# Patient Record
Sex: Male | Born: 1985 | Race: White | Hispanic: No | Marital: Single | Smoking: Current every day smoker
Health system: Southern US, Community
[De-identification: ages and names within clinical notes are randomized; demographics above are authoritative.]

## PROBLEM LIST (undated history)

## (undated) DIAGNOSIS — Q6689 Other  specified congenital deformities of feet: Secondary | ICD-10-CM

---

## 2012-11-08 ENCOUNTER — Emergency Department: Payer: Self-pay | Admitting: Emergency Medicine

## 2013-12-18 ENCOUNTER — Emergency Department: Payer: Self-pay | Admitting: Emergency Medicine

## 2014-04-30 ENCOUNTER — Emergency Department: Payer: Self-pay | Admitting: Emergency Medicine

## 2014-05-14 ENCOUNTER — Emergency Department: Payer: Self-pay | Admitting: Emergency Medicine

## 2014-07-11 ENCOUNTER — Emergency Department: Payer: Self-pay | Admitting: Emergency Medicine

## 2014-07-11 LAB — URINALYSIS, COMPLETE
BACTERIA: NONE SEEN
BLOOD: NEGATIVE
Bilirubin,UR: NEGATIVE
GLUCOSE, UR: NEGATIVE mg/dL (ref 0–75)
Ketone: NEGATIVE
Leukocyte Esterase: NEGATIVE
NITRITE: NEGATIVE
PROTEIN: NEGATIVE
Ph: 6 (ref 4.5–8.0)
RBC,UR: NONE SEEN /HPF (ref 0–5)
SQUAMOUS EPITHELIAL: NONE SEEN
Specific Gravity: 1.014 (ref 1.003–1.030)
WBC UR: NONE SEEN /HPF (ref 0–5)

## 2014-07-14 ENCOUNTER — Emergency Department: Payer: Self-pay | Admitting: Emergency Medicine

## 2014-07-14 LAB — CBC
HCT: 47.1 % (ref 40.0–52.0)
HGB: 15.9 g/dL (ref 13.0–18.0)
MCH: 33 pg (ref 26.0–34.0)
MCHC: 33.8 g/dL (ref 32.0–36.0)
MCV: 97 fL (ref 80–100)
Platelet: 227 10*3/uL (ref 150–440)
RBC: 4.83 10*6/uL (ref 4.40–5.90)
RDW: 12.8 % (ref 11.5–14.5)
WBC: 9.6 10*3/uL (ref 3.8–10.6)

## 2014-07-14 LAB — COMPREHENSIVE METABOLIC PANEL
ALBUMIN: 4 g/dL (ref 3.4–5.0)
ALK PHOS: 81 U/L
AST: 30 U/L (ref 15–37)
Anion Gap: 7 (ref 7–16)
BILIRUBIN TOTAL: 0.2 mg/dL (ref 0.2–1.0)
BUN: 9 mg/dL (ref 7–18)
CALCIUM: 8.6 mg/dL (ref 8.5–10.1)
CHLORIDE: 110 mmol/L — AB (ref 98–107)
CO2: 25 mmol/L (ref 21–32)
Creatinine: 0.86 mg/dL (ref 0.60–1.30)
EGFR (African American): >60
EGFR (Non-African Amer.): >60
GLUCOSE: 101 mg/dL — AB (ref 65–99)
Osmolality: 282 (ref 275–301)
Potassium: 3.6 mmol/L (ref 3.5–5.1)
SGPT (ALT): 50 U/L (ref 12–78)
Sodium: 142 mmol/L (ref 136–145)
Total Protein: 8.1 g/dL (ref 6.4–8.2)

## 2014-07-14 LAB — ETHANOL
ETHANOL %: 0.184 % — AB (ref 0.000–0.080)
ETHANOL LVL: 184 mg/dL

## 2014-07-14 LAB — SALICYLATE LEVEL: Salicylates, Serum: 4.7 mg/dL — ABNORMAL HIGH

## 2014-07-14 LAB — ACETAMINOPHEN LEVEL: Acetaminophen: <2 ug/mL

## 2014-07-31 ENCOUNTER — Emergency Department: Payer: Self-pay | Admitting: Emergency Medicine

## 2014-07-31 LAB — URINALYSIS, COMPLETE
BILIRUBIN, UR: NEGATIVE
Bacteria: NONE SEEN
Blood: NEGATIVE
Glucose,UR: NEGATIVE mg/dL (ref 0–75)
KETONE: NEGATIVE
Leukocyte Esterase: NEGATIVE
Nitrite: NEGATIVE
Ph: 6 (ref 4.5–8.0)
Protein: NEGATIVE
RBC, UR: NONE SEEN /HPF (ref 0–5)
Specific Gravity: 1.005 (ref 1.003–1.030)
Squamous Epithelial: NONE SEEN

## 2014-08-08 ENCOUNTER — Emergency Department: Payer: Self-pay | Admitting: Emergency Medicine

## 2014-08-08 LAB — SYNOVIAL CELL COUNT + DIFF, W/ CRYSTALS
Basophil: 0 %
Crystals, Joint Fluid: NONE SEEN
EOS PCT: 0 %
Lymphocytes: 2 %
Neutrophils: 97 %
Nucleated Cell Count: 15697 /mm3
Other Cells BF: 0 %
Other Mononuclear Cells: 1 %

## 2014-08-08 LAB — GRAM STAIN

## 2014-08-31 ENCOUNTER — Emergency Department: Payer: Self-pay | Admitting: Emergency Medicine
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
## 2015-05-31 ENCOUNTER — Emergency Department: Payer: Medicaid Other

## 2015-05-31 ENCOUNTER — Encounter: Payer: Self-pay | Admitting: Emergency Medicine

## 2015-05-31 ENCOUNTER — Emergency Department
Admission: EM | Admit: 2015-05-31 | Discharge: 2015-06-01 | Disposition: A | Discharge status: Home / Self Care | Payer: Medicaid Other | Attending: Emergency Medicine | Admitting: Emergency Medicine

## 2015-05-31 DIAGNOSIS — Y9289 Other specified places as the place of occurrence of the external cause: Secondary | ICD-10-CM | POA: Diagnosis not present

## 2015-05-31 DIAGNOSIS — M21549 Acquired clubfoot, unspecified foot: Secondary | ICD-10-CM | POA: Insufficient documentation

## 2015-05-31 DIAGNOSIS — Y998 Other external cause status: Secondary | ICD-10-CM | POA: Insufficient documentation

## 2015-05-31 DIAGNOSIS — Z880 Allergy status to penicillin: Secondary | ICD-10-CM | POA: Diagnosis not present

## 2015-05-31 DIAGNOSIS — W010XXA Fall on same level from slipping, tripping and stumbling without subsequent striking against object, initial encounter: Secondary | ICD-10-CM | POA: Diagnosis not present

## 2015-05-31 DIAGNOSIS — M79671 Pain in right foot: Secondary | ICD-10-CM

## 2015-05-31 DIAGNOSIS — S99921A Unspecified injury of right foot, initial encounter: Secondary | ICD-10-CM | POA: Diagnosis not present

## 2015-05-31 DIAGNOSIS — Z720 Tobacco use: Secondary | ICD-10-CM | POA: Diagnosis not present

## 2015-05-31 DIAGNOSIS — Y9389 Activity, other specified: Secondary | ICD-10-CM | POA: Diagnosis not present

## 2015-05-31 HISTORY — DX: Other specified congenital deformities of feet: Q66.89

## 2015-05-31 MED ORDER — HYDROCODONE-ACETAMINOPHEN 5-325 MG PO TABS
ORAL_TABLET | ORAL | Status: AC
  Filled 2015-05-31: qty 1

## 2015-05-31 MED ORDER — IBUPROFEN 800 MG PO TABS: 800.0000 mg | ORAL_TABLET | Freq: Once | ORAL | Status: DC

## 2015-05-31 MED ORDER — HYDROCODONE-ACETAMINOPHEN 5-325 MG PO TABS: 1.0000 | ORAL_TABLET | ORAL | Status: AC | PRN

## 2015-05-31 MED ORDER — HYDROCODONE-ACETAMINOPHEN 5-325 MG PO TABS
1.0000 | ORAL_TABLET | Freq: Once | ORAL | Status: DC
Start: 2015-05-31 — End: 2015-06-01

## 2015-05-31 MED ORDER — HYDROCODONE-ACETAMINOPHEN 5-325 MG PO TABS
2.0000 | ORAL_TABLET | Freq: Once | ORAL | Status: DC
Start: 2015-05-31 — End: 2015-05-31

## 2015-05-31 MED ORDER — IBUPROFEN 800 MG PO TABS: 800.0000 mg | ORAL_TABLET | Freq: Three times a day (TID) | ORAL | Status: AC | PRN

## 2015-05-31 MED ORDER — IBUPROFEN 800 MG PO TABS
ORAL_TABLET | ORAL | Status: DC
Start: 2015-05-31 — End: 2015-05-31
  Filled 2015-05-31: qty 1

## 2015-05-31 NOTE — ED Notes (Signed)
Pt arrived to the ED accompanied by his significant other complaining of left foot and ankle pain. 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 Pt states that he has been falling more lately because of increased pain and swelling. Pt is AOx4 in no apparent distress during triage.

## 2015-05-31 NOTE — ED Notes (Signed)
Pt escorted to lobby by [employer] via wheelchair.

## 2015-05-31 NOTE — ED Notes (Signed)
Pt up and ambulating to bathroom without difficulty. Pt continues to ask about his girlfriend coming back to exam room. Pt advised his girlfriend is outside and when she returns we will get her back to room.

## 2015-05-31 NOTE — ED Notes (Signed)
Pt refused his medications 

## 2015-05-31 NOTE — ED Provider Notes (Signed)
[facility] Emergency Department Provider Note  ____________________________________________  Time seen: Approximately 10:44 PM  I have reviewed the triage vital signs and the nursing notes.   HISTORY  Chief Complaint Foot Pain and Ankle Pain   HPI John Mckay is a 29 y.o. male presents with complaints of continued right foot pain. 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 Patient states he's post have surgery on his foot but is never got around to it.   Past Medical History  Diagnosis Date  . Club foot     There are no active problems to display for this patient.   History reviewed. No pertinent past surgical history.  Current Outpatient Rx  Name  Route  Sig  Dispense  Refill  . HYDROcodone-acetaminophen (NORCO) 5-325 MG per tablet   Oral   Take 1-2 tablets by mouth every 4 (four) hours as needed for moderate pain.   15 tablet   0   . ibuprofen (ADVIL,MOTRIN) 800 MG tablet   Oral   Take 1 tablet (800 mg total) by mouth every 8 (eight) hours as needed.   30 tablet   0     Allergies Penicillins  History reviewed. No pertinent family history.  Social History History  Substance Use Topics  . Smoking status: Current Every Day Smoker -- 1.00 packs/day for 10 years  . Smokeless tobacco: Not on file  . Alcohol Use: Yes    Review of Systems Constitutional: No fever/chills Eyes: No visual changes. ENT: No sore throat. Cardiovascular: Denies chest pain. Respiratory: Denies shortness of breath. Gastrointestinal: No abdominal pain.  No nausea, no vomiting.  No diarrhea.  No constipation. Genitourinary: Negative for dysuria. Musculoskeletal: Positive for continued right foot pain. Skin: Negative for rash. Neurological: Negative for headaches, focal weakness or numbness.  10-point ROS otherwise negative.  ____________________________________________   PHYSICAL  EXAM:  VITAL SIGNS: ED Triage Vitals  Enc Vitals Group     BP 05/31/15 2152 136/81 mmHg     Pulse Rate 05/31/15 2152 99     Resp 05/31/15 2152 18     Temp 05/31/15 2152 97.8 F (36.6 C)     Temp Source 05/31/15 2152 Oral     SpO2 05/31/15 2152 95 %     Weight 05/31/15 2152 136 lb (61.689 kg)     Height 05/31/15 2152 5\' 8"  (1.727 m)     Head Cir --      Peak Flow --      Pain Score 05/31/15 2154 10     Pain Loc --      Pain Edu? --      Excl. in GC? --     Constitutional: Alert and oriented. Well appearing and in no acute distress. Musculoskeletal: No lower extremity tenderness nor edema.  No joint effusions. Neurologic:  Normal speech and language. No gross focal neurologic deficits are appreciated. Speech is normal. No gait instability. Positive clubfoot noted. Skin:  Skin is warm, dry and intact. No rash noted. Psychiatric: Mood and affect are normal. Speech and behavior are normal.  ____________________________________________   LABS (all labs ordered are listed, but only abnormal results are displayed)  Labs Reviewed - No data to display ____________________________________________  EKG  Not applicable ____________________________________________  RADIOLOGY  Negative for fracture dislocation. ____________________________________________   PROCEDURES  Procedure(s) performed: None  Critical Care performed: No  ____________________________________________   INITIAL IMPRESSION / ASSESSMENT AND PLAN /  ED COURSE  Pertinent labs & imaging results that were available during my care of the patient were reviewed by me and considered in my medical decision making (see chart for details).  Right foot sprain. Reassurance provided Rx given for pain medication anti-inflammatories. Patient to follow up with orthopedic doctor as scheduled. Verbalizes understanding and denies any other emergency medical conditions at this time. Discharged  home ____________________________________________   FINAL CLINICAL IMPRESSION(S) / ED DIAGNOSES  Final diagnoses:  Foot pain, right      Evangeline Dakin, PA-C 05/31/15 1610  Maurilio Lovely, MD 06/01/15 0030

## 2015-05-31 NOTE — ED Notes (Signed)
Pt constantly asking about gf coming to room. Pt's gf has been arrested by [employer] for DUI. Pt aware and out to parking lot to speak with officer. Pt became argumentative with [employer] officer. Pt advised to complete his treatment and return to room. Pt brought back to room by [employer].

## 2015-06-01 ENCOUNTER — Emergency Department: Payer: Medicaid Other

## 2015-06-01 ENCOUNTER — Encounter: Payer: Self-pay | Admitting: Emergency Medicine

## 2015-06-01 DIAGNOSIS — Z720 Tobacco use: Secondary | ICD-10-CM | POA: Diagnosis not present

## 2015-06-01 DIAGNOSIS — M21542 Acquired clubfoot, left foot: Secondary | ICD-10-CM | POA: Insufficient documentation

## 2015-06-01 DIAGNOSIS — Y998 Other external cause status: Secondary | ICD-10-CM | POA: Diagnosis not present

## 2015-06-01 DIAGNOSIS — S40011A Contusion of right shoulder, initial encounter: Secondary | ICD-10-CM | POA: Insufficient documentation

## 2015-06-01 DIAGNOSIS — Y9389 Activity, other specified: Secondary | ICD-10-CM | POA: Diagnosis not present

## 2015-06-01 DIAGNOSIS — Y9289 Other specified places as the place of occurrence of the external cause: Secondary | ICD-10-CM | POA: Diagnosis not present

## 2015-06-01 DIAGNOSIS — S4991XA Unspecified injury of right shoulder and upper arm, initial encounter: Secondary | ICD-10-CM | POA: Diagnosis present

## 2015-06-01 MED ORDER — KETOROLAC TROMETHAMINE 10 MG PO TABS: 10.0000 mg | ORAL_TABLET | Freq: Once | ORAL | Status: DC

## 2015-06-01 MED ORDER — KETOROLAC TROMETHAMINE 10 MG PO TABS
ORAL_TABLET | ORAL | Status: AC
  Filled 2015-06-01: qty 1
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
## 2015-06-01 NOTE — Discharge Instructions (Signed)
Contusion °A contusion is a deep bruise. Contusions are the result of an injury that caused bleeding under the skin. The contusion may turn blue, purple, or yellow. Minor injuries will give you a painless contusion, but more severe contusions may stay painful and swollen for a few weeks.  °CAUSES  °A contusion is usually caused by a blow, trauma, or direct force to an area of the body. °SYMPTOMS  °· Swelling and redness of the injured area. °· Bruising of the injured area. °· Tenderness and soreness of the injured area. °· Pain. °DIAGNOSIS  °The diagnosis can be made by taking a history and physical exam. An X-ray, CT scan, or MRI may be needed to determine if there were any associated injuries, such as fractures. °TREATMENT  °Specific treatment will depend on what area of the body was injured. In general, the best treatment for a contusion is resting, icing, elevating, and applying cold compresses to the injured area. Over-the-counter medicines may also be recommended for pain control. Ask your caregiver what the best treatment is for your contusion. °HOME CARE INSTRUCTIONS  °· Put ice on the injured area. °¨ Put ice in a plastic bag. °¨ Place a towel between your skin and the bag. °¨ Leave the ice on for 15-20 minutes, 3-4 times a day, or as directed by your health care provider. °· Only take over-the-counter or prescription medicines for pain, discomfort, or fever as directed by your caregiver. Your caregiver may recommend avoiding anti-inflammatory medicines (aspirin, ibuprofen, and naproxen) for 48 hours because these medicines may increase bruising. °· Rest the injured area. °· If possible, elevate the injured area to reduce swelling. °SEEK IMMEDIATE MEDICAL CARE IF:  °· You have increased bruising or swelling. °· You have pain that is getting worse. °· Your swelling or pain is not relieved with medicines. °MAKE SURE YOU:  °· Understand these instructions. °· Will watch your condition. °· Will get help right  away if you are not doing well or get worse. °Document Released: 09/20/2005 Document Revised: 12/16/2013 Document Reviewed: 10/16/2011 °[employer]® Patient Information ©2015 [employer]. This information is not intended to replace advice given to you by your health care provider. Make sure you discuss any questions you have with your health care provider. ° °
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
## 2015-06-02 ENCOUNTER — Emergency Department
Admission: EM | Admit: 2015-06-02 | Discharge: 2015-06-02 | Disposition: A | Discharge status: Home / Self Care | Payer: Medicaid Other | Attending: Emergency Medicine | Admitting: Emergency Medicine

## 2015-06-02 DIAGNOSIS — S40011A Contusion of right shoulder, initial encounter: Secondary | ICD-10-CM

## 2015-06-02 MED ORDER — ACETAMINOPHEN 325 MG PO TABS
650.0000 mg | ORAL_TABLET | Freq: Once | ORAL | Status: AC
  Administered 2015-06-02: 650 mg via ORAL
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
## 2016-07-25 IMAGING — CR DG THORACIC SPINE 2-3V
1 series · 3 of 3 positions shown · non-contrast
Comparison: None.

CLINICAL DATA: Thoracic spine tenderness after a fall.

EXAM:
THORACIC SPINE - 2 VIEW

[Series 1: t thoracic spine ap · 0.14mm/px · 3 of 3 slices shown]
[im 1/3]
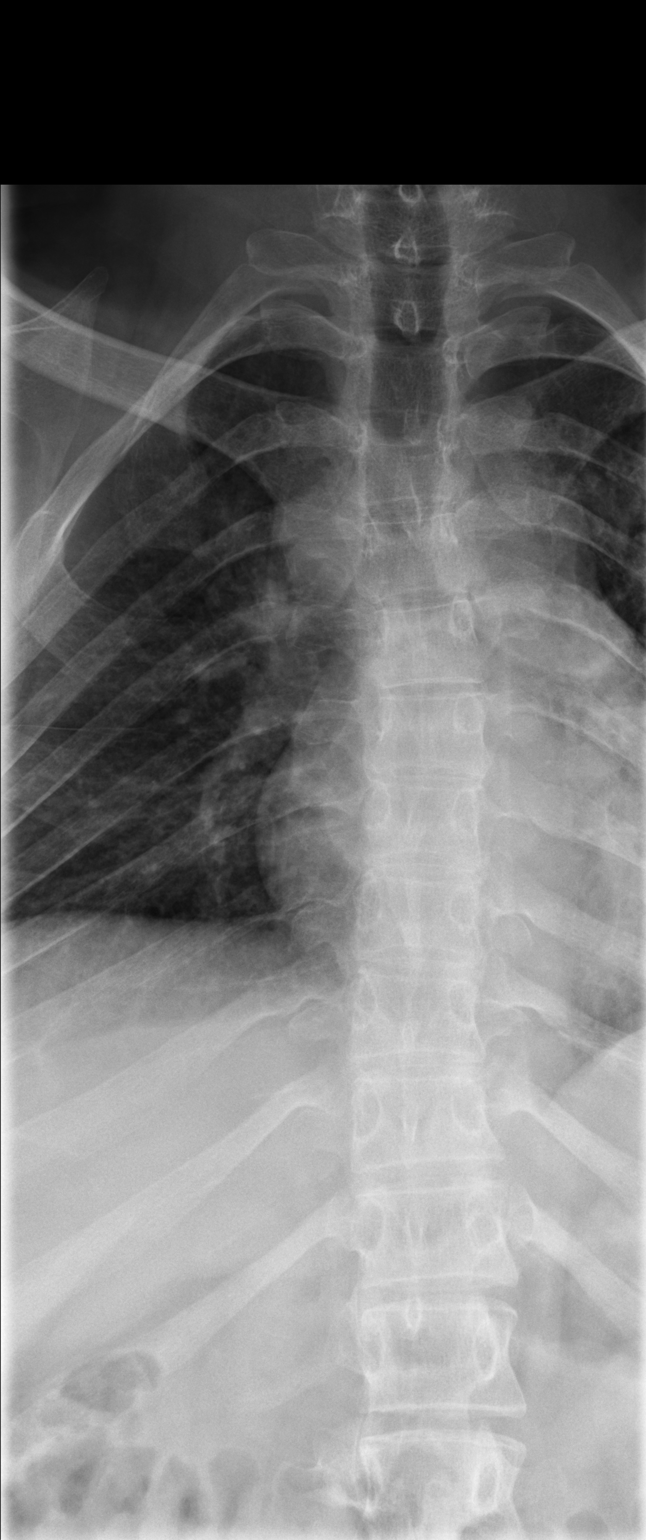
[im 2/3]
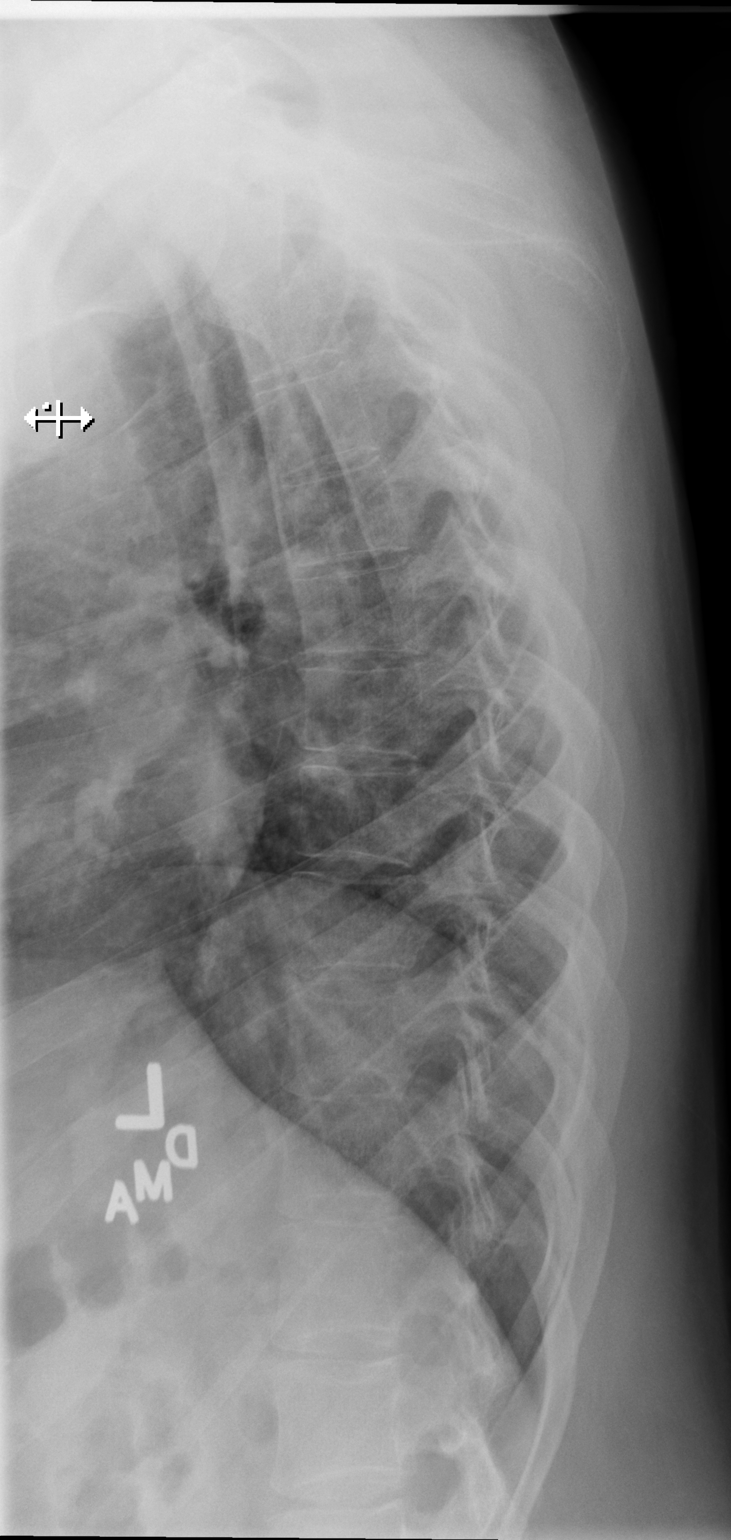
[im 3/3]
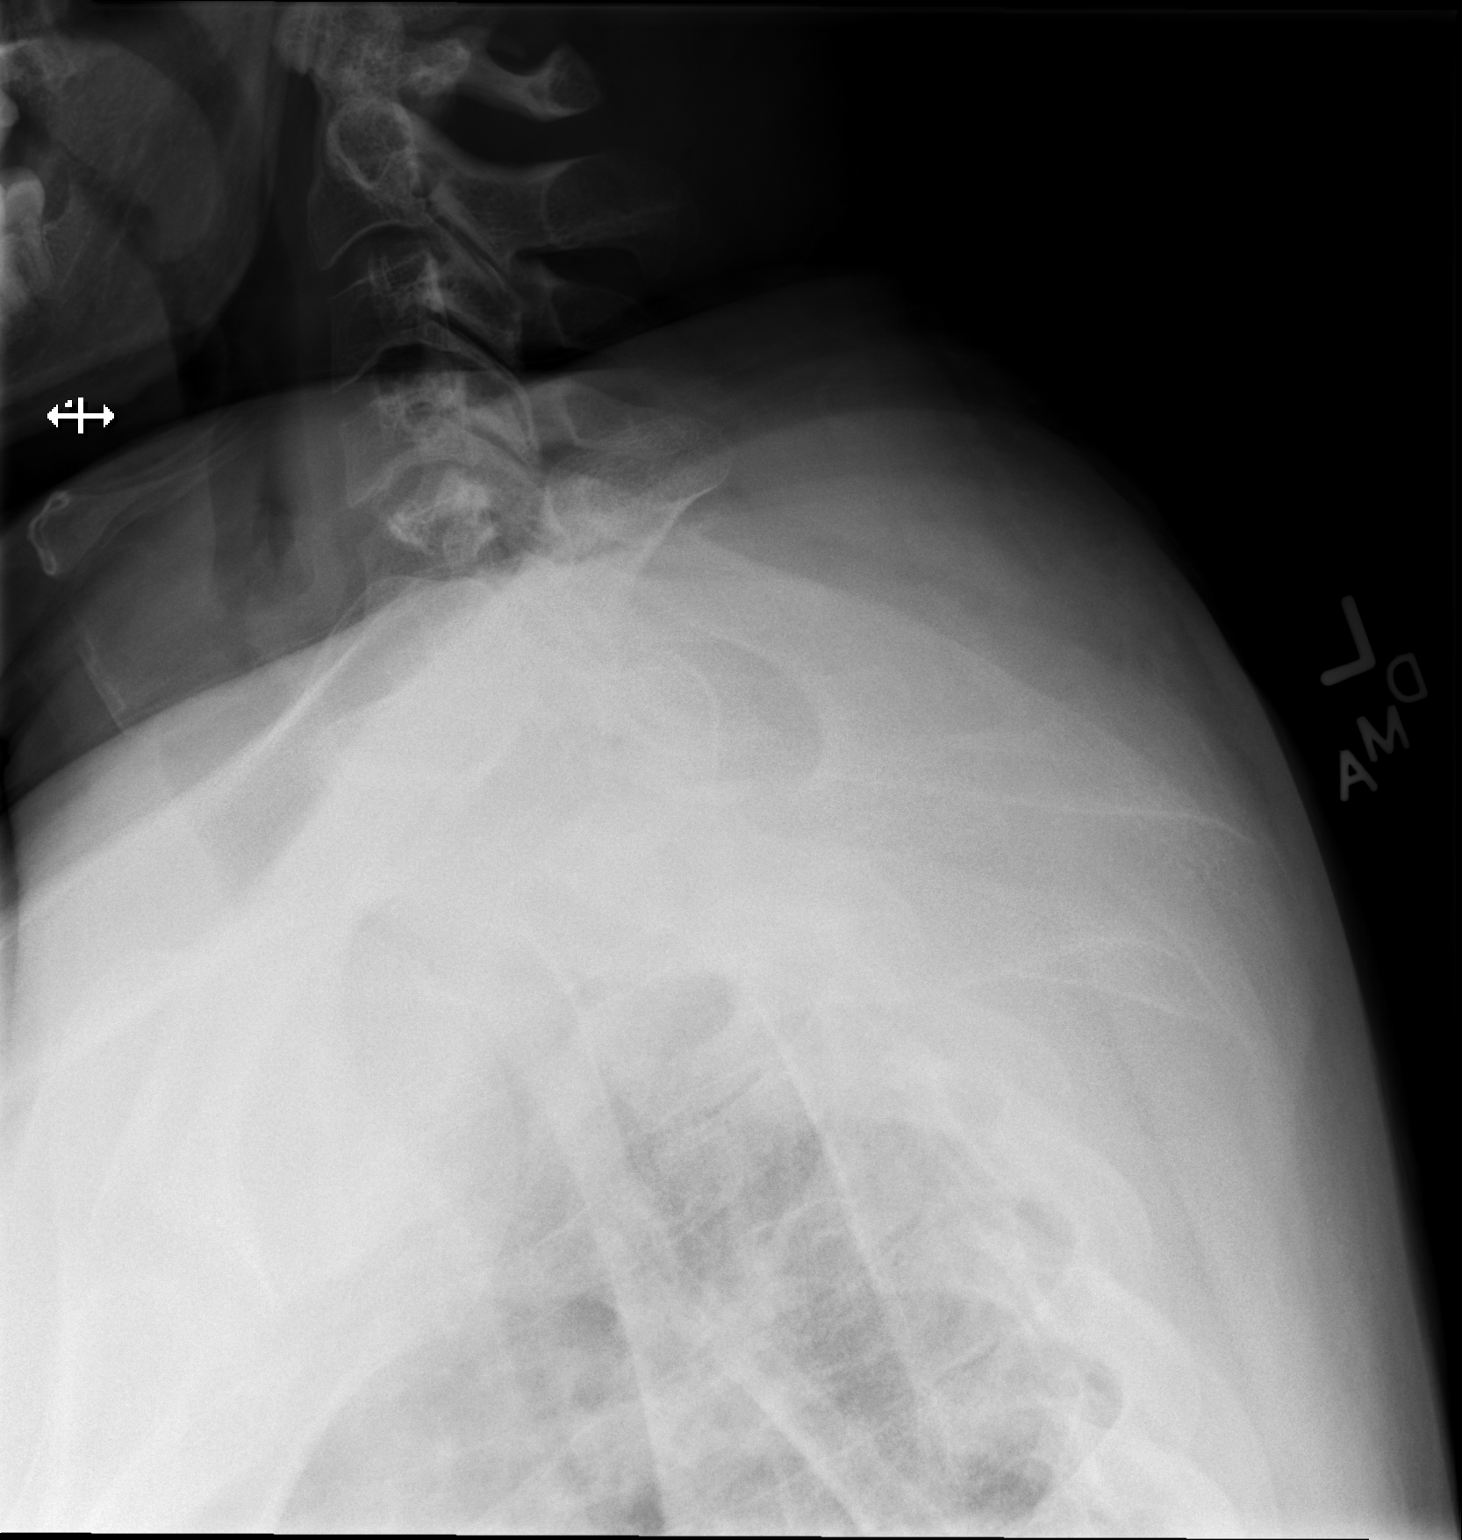

[3 of 3 positions shown; findings below may reference images not displayed]

FINDINGS: Alignment is anatomic. Cervicothoracic junction is relatively
obscured by the patient's shoulders. Vertebral body height is
maintained.
IMPRESSION: 1. Cervicothoracic junction is obscured by the patient's shoulders.
2. Otherwise, no fracture or alignment abnormality.
